# Patient Record
Sex: Female | Born: 1935 | Race: White | Hispanic: No | Smoking: Never smoker
Health system: Southern US, Community
[De-identification: ages and names within clinical notes are randomized; demographics above are authoritative.]

## PROBLEM LIST (undated history)

## (undated) DIAGNOSIS — I4891 Unspecified atrial fibrillation: Secondary | ICD-10-CM

## (undated) DIAGNOSIS — G309 Alzheimer's disease, unspecified: Secondary | ICD-10-CM

## (undated) DIAGNOSIS — F29 Unspecified psychosis not due to a substance or known physiological condition: Secondary | ICD-10-CM

## (undated) DIAGNOSIS — F0280 Dementia in other diseases classified elsewhere without behavioral disturbance: Secondary | ICD-10-CM

## (undated) DIAGNOSIS — K219 Gastro-esophageal reflux disease without esophagitis: Secondary | ICD-10-CM

## (undated) DIAGNOSIS — E785 Hyperlipidemia, unspecified: Secondary | ICD-10-CM

## (undated) DIAGNOSIS — F32A Depression, unspecified: Secondary | ICD-10-CM

## (undated) DIAGNOSIS — K589 Irritable bowel syndrome without diarrhea: Secondary | ICD-10-CM

## (undated) DIAGNOSIS — Z66 Do not resuscitate: Secondary | ICD-10-CM

## (undated) DIAGNOSIS — F329 Major depressive disorder, single episode, unspecified: Secondary | ICD-10-CM

---

## 2013-01-27 ENCOUNTER — Emergency Department (HOSPITAL_BASED_OUTPATIENT_CLINIC_OR_DEPARTMENT_OTHER): Payer: Medicare Other

## 2013-01-27 ENCOUNTER — Emergency Department (HOSPITAL_BASED_OUTPATIENT_CLINIC_OR_DEPARTMENT_OTHER)
Admission: EM | Admit: 2013-01-27 | Discharge: 2013-01-27 | Disposition: A | Discharge status: Home / Self Care | Payer: Medicare Other | Attending: Emergency Medicine | Admitting: Emergency Medicine

## 2013-01-27 ENCOUNTER — Emergency Department (HOSPITAL_COMMUNITY): Payer: Self-pay

## 2013-01-27 ENCOUNTER — Encounter (HOSPITAL_BASED_OUTPATIENT_CLINIC_OR_DEPARTMENT_OTHER): Payer: Self-pay | Admitting: Student

## 2013-01-27 DIAGNOSIS — Z79899 Other long term (current) drug therapy: Secondary | ICD-10-CM | POA: Insufficient documentation

## 2013-01-27 DIAGNOSIS — G309 Alzheimer's disease, unspecified: Secondary | ICD-10-CM | POA: Insufficient documentation

## 2013-01-27 DIAGNOSIS — E785 Hyperlipidemia, unspecified: Secondary | ICD-10-CM | POA: Insufficient documentation

## 2013-01-27 DIAGNOSIS — Y9229 Other specified public building as the place of occurrence of the external cause: Secondary | ICD-10-CM | POA: Insufficient documentation

## 2013-01-27 DIAGNOSIS — F3289 Other specified depressive episodes: Secondary | ICD-10-CM | POA: Insufficient documentation

## 2013-01-27 DIAGNOSIS — I4891 Unspecified atrial fibrillation: Secondary | ICD-10-CM | POA: Insufficient documentation

## 2013-01-27 DIAGNOSIS — F329 Major depressive disorder, single episode, unspecified: Secondary | ICD-10-CM | POA: Insufficient documentation

## 2013-01-27 DIAGNOSIS — W1809XA Striking against other object with subsequent fall, initial encounter: Secondary | ICD-10-CM | POA: Insufficient documentation

## 2013-01-27 DIAGNOSIS — S0003XA Contusion of scalp, initial encounter: Secondary | ICD-10-CM | POA: Insufficient documentation

## 2013-01-27 DIAGNOSIS — S1093XA Contusion of unspecified part of neck, initial encounter: Secondary | ICD-10-CM | POA: Insufficient documentation

## 2013-01-27 DIAGNOSIS — F0280 Dementia in other diseases classified elsewhere without behavioral disturbance: Secondary | ICD-10-CM | POA: Insufficient documentation

## 2013-01-27 DIAGNOSIS — Z8719 Personal history of other diseases of the digestive system: Secondary | ICD-10-CM | POA: Insufficient documentation

## 2013-01-27 DIAGNOSIS — Y9301 Activity, walking, marching and hiking: Secondary | ICD-10-CM | POA: Insufficient documentation

## 2013-01-27 DIAGNOSIS — F29 Unspecified psychosis not due to a substance or known physiological condition: Secondary | ICD-10-CM | POA: Insufficient documentation

## 2013-01-27 DIAGNOSIS — Z23 Encounter for immunization: Secondary | ICD-10-CM | POA: Insufficient documentation

## 2013-01-27 HISTORY — DX: Dementia in other diseases classified elsewhere, unspecified severity, without behavioral disturbance, psychotic disturbance, mood disturbance, and anxiety: F02.80

## 2013-01-27 HISTORY — DX: Unspecified atrial fibrillation: I48.91

## 2013-01-27 HISTORY — DX: Unspecified psychosis not due to a substance or known physiological condition: F29

## 2013-01-27 HISTORY — DX: Hyperlipidemia, unspecified: E78.5

## 2013-01-27 HISTORY — DX: Alzheimer's disease, unspecified: G30.9

## 2013-01-27 HISTORY — DX: Do not resuscitate: Z66

## 2013-01-27 HISTORY — DX: Depression, unspecified: F32.A

## 2013-01-27 HISTORY — DX: Gastro-esophageal reflux disease without esophagitis: K21.9

## 2013-01-27 HISTORY — DX: Major depressive disorder, single episode, unspecified: F32.9

## 2013-01-27 HISTORY — DX: Irritable bowel syndrome, unspecified: K58.9

## 2013-01-27 MED ORDER — TETANUS-DIPHTH-ACELL PERTUSSIS 5-2.5-18.5 LF-MCG/0.5 IM SUSP
0.5000 mL | Freq: Once | INTRAMUSCULAR | Status: AC
  Administered 2013-01-27: 0.5 mL via INTRAMUSCULAR
  Filled 2013-01-27: qty 0.5

## 2013-01-27 NOTE — ED Notes (Signed)
Pt in via EMS from [facility] with c/o falling on curb while ambulating and hitting back of head. Presents to ED with golf ball sized hematoma. No LOC s/p or prior to fall. Pt is part of skilled memory unit and AO x 3 but not to time - baseline. PERRLA, follows commands, grips equal. No bleeding at site of impact.

## 2013-01-27 NOTE — ED Provider Notes (Signed)
CSN: [id number]     Arrival date & time 01/27/13  1957 History     First MD Initiated Contact with Patient 01/27/13 2004     Chief Complaint  Patient presents with  . Fall   (Consider location/radiation/quality/duration/timing/severity/associated sxs/prior Treatment) HPI Comments: Patient is an 79 year old female with a history of Alzheimer's dementia and atrial fibrillation who presents after a mechanical fall this evening. Patient states that she was leaving a restaurant after a birthday dinner when she lost her footing on the curb, subsequently falling backwards. Patient hit her head on the pavement and denies loss of consciousness. She denies any pain complaints. Patient further denies vision changes, difficulty speaking or swallowing, tinnitus, nausea, vomiting, numbness/tingling, extremity weakness, and an inability to ambulate. Patient denies the use of blood thinners.  Patient is a 79 y.o. female presenting with fall. The history is provided by the patient. No language interpreter was used.  Fall Pertinent negatives include no fever, nausea, numbness, vomiting or weakness.    Past Medical History  Diagnosis Date  . Alzheimer disease   . Depression   . Psychosis   . Atrial fibrillation   . Hyperlipidemia   . IBS (irritable bowel syndrome)   . GERD (gastroesophageal reflux disease)   . DNR (do not resuscitate)    History reviewed. No pertinent past surgical history. No family history on file. History  Substance Use Topics  . Smoking status: Never Smoker   . Smokeless tobacco: Not on file  . Alcohol Use: No   OB History   Grav Para Term Preterm Abortions TAB SAB Ect Mult Living                 Review of Systems  Constitutional: Negative for fever.  HENT: Negative for hearing loss, trouble swallowing and tinnitus.   Eyes: Negative for visual disturbance.  Gastrointestinal: Negative for nausea and vomiting.  Musculoskeletal: Negative for gait problem.  Neurological:  Negative for syncope, speech difficulty, weakness and numbness.    Allergies  Review of patient's allergies indicates no known allergies.  Home Medications   Current Outpatient Rx  Name  Route  Sig  Dispense  Refill  . acetaminophen (TYLENOL) 500 MG tablet   Oral   Take 500 mg by mouth every 6 (six) hours as needed for pain.         Marland Kitchen atorvastatin (LIPITOR) 20 MG tablet   Oral   Take 20 mg by mouth daily.         . digoxin (LANOXIN) 0.125 MG tablet   Oral   Take 0.125 mg by mouth daily.         Marland Kitchen diltiazem (DILACOR XR) 180 MG 24 hr capsule   Oral   Take 180 mg by mouth daily.         . Eyelid Cleansers (OCUSOFT LID SCRUB PLUS EX)   Apply externally   Apply topically.         Marland Kitchen levothyroxine (SYNTHROID, LEVOTHROID) 75 MCG tablet   Oral   Take 75 mcg by mouth daily before breakfast.         . memantine (NAMENDA) 5 MG tablet   Oral   Take 28 mg by mouth daily.         Marland Kitchen perphenazine (TRILAFON) 2 MG tablet   Oral   Take 2 mg by mouth 2 (two) times daily.         . propafenone (RYTHMOL) 150 MG tablet   Oral   Take  150 mg by mouth every 8 (eight) hours.         . propantheline (PROBANTHINE) 15 MG tablet   Oral   Take 15 mg by mouth 4 (four) times daily.         . risperiDONE (RISPERDAL) 0.25 MG tablet   Oral   Take 0.25 mg by mouth 2 (two) times daily.         . rivastigmine (EXELON) 9.5 mg/24hr   Transdermal   Place 1 patch onto the skin daily.         . sertraline (ZOLOFT) 100 MG tablet   Oral   Take 100 mg by mouth daily.         . traMADol (ULTRAM) 50 MG tablet   Oral   Take 50 mg by mouth every 8 (eight) hours as needed for pain.          BP 156/77  Pulse 54  Temp(Src) 98 F (36.7 C) (Oral)  Resp 18  SpO2 96%  Physical Exam  Nursing note and vitals reviewed. Constitutional: She is oriented to person, place, and time. She appears well-developed and well-nourished. No distress.  HENT:  Head: Normocephalic.   Mouth/Throat: Oropharynx is clear and moist. No oropharyngeal exudate.  Hematoma to posterior parietal scalp 3cm in diameter; nontender  Eyes: Conjunctivae and EOM are normal. Pupils are equal, round, and reactive to light. No scleral icterus.  Neck: Neck supple.  No tenderness to palpation of the cervical midline  Cardiovascular: Normal rate, regular rhythm, normal heart sounds and intact distal pulses.   Pulmonary/Chest: Effort normal and breath sounds normal. No respiratory distress. She has no wheezes. She has no rales.  Abdominal: Soft. She exhibits no distension. There is no tenderness.  Musculoskeletal: Normal range of motion.  Lymphadenopathy:    She has no cervical adenopathy.  Neurological: She is alert and oriented to person, place, and time.  Patient speaks in full goal oriented sentences. Cranial nerves III through XII grossly intact. No sensory or motor deficits appreciated and patient was extremities without ataxia. Grip strength 5 out of 5 in bilateral upper and lower extremities. DTRs normal and symmetric. Patient mentating at baseline.  Skin: Skin is warm and dry. No rash noted. She is not diaphoretic. No pallor.  Psychiatric: She has a normal mood and affect. Her behavior is normal.   ED Course   Procedures (including critical care time)  Labs Reviewed - No data to display Ct Head Wo Contrast  01/27/2013   *RADIOLOGY REPORT*  Clinical Data:  79 year old female status post fall with posterior head injury, hematoma.  CT HEAD WITHOUT CONTRAST CT CERVICAL SPINE WITHOUT CONTRAST  Technique:  Multidetector CT imaging of the head and cervical spine was performed following the standard protocol without intravenous contrast.  Multiplanar CT image reconstructions of the cervical spine were also generated.  Comparison:   None  CT HEAD  Findings: Posterior right superior convexity scalp hematoma measuring up to 9 mm in thickness.  Underlying calvarium intact. Visible scalp and orbits  soft tissues elsewhere are within normal limits.  Visualized paranasal sinuses and mastoids are clear.  Calcified atherosclerosis at the skull base.  Cerebral volume is within normal limits for age.  No midline shift, ventriculomegaly, mass effect, evidence of mass lesion, intracranial hemorrhage or evidence of cortically based acute infarction.  Gray-white matter differentiation is within normal limits throughout the brain.  No suspicious intracranial vascular hyperdensity.  IMPRESSION: 1.  Right vertex scalp hematoma without underlying calvarium  fracture. 2. Normal noncontrast CT appearance of the brain for age. 3.  Cervical spine findings below.  CT CERVICAL SPINE  Findings: Visualized skull base is intact.  No atlanto-occipital dissociation.  Cervicothoracic junction alignment is within normal limits.  Bilateral posterior element alignment is within normal limits.  Trace anterolisthesis of C4 on C5.  Multilevel cervical disc space loss and endplate degeneration.  No acute cervical spine fracture identified.  Visualized upper thoracic levels appear grossly intact.  Negative lung apices.  Left carotid bifurcation calcified atherosclerosis.  Dental hardware streak artifact. Visualized paraspinal soft tissues are within normal limits.  IMPRESSION: No acute fracture or listhesis identified in the cervical spine. Ligamentous injury is not excluded.  Trace anterolisthesis of C4 on C5 appears to be degenerative.   Original Report Authenticated By: Erskine Speed, M.D.   Ct Cervical Spine Wo Contrast  01/27/2013   *RADIOLOGY REPORT*  Clinical Data:  79 year old female status post fall with posterior head injury, hematoma.  CT HEAD WITHOUT CONTRAST CT CERVICAL SPINE WITHOUT CONTRAST  Technique:  Multidetector CT imaging of the head and cervical spine was performed following the standard protocol without intravenous contrast.  Multiplanar CT image reconstructions of the cervical spine were also generated.  Comparison:    None  CT HEAD  Findings: Posterior right superior convexity scalp hematoma measuring up to 9 mm in thickness.  Underlying calvarium intact. Visible scalp and orbits soft tissues elsewhere are within normal limits.  Visualized paranasal sinuses and mastoids are clear.  Calcified atherosclerosis at the skull base.  Cerebral volume is within normal limits for age.  No midline shift, ventriculomegaly, mass effect, evidence of mass lesion, intracranial hemorrhage or evidence of cortically based acute infarction.  Gray-white matter differentiation is within normal limits throughout the brain.  No suspicious intracranial vascular hyperdensity.  IMPRESSION: 1.  Right vertex scalp hematoma without underlying calvarium fracture. 2. Normal noncontrast CT appearance of the brain for age. 3.  Cervical spine findings below.  CT CERVICAL SPINE  Findings: Visualized skull base is intact.  No atlanto-occipital dissociation.  Cervicothoracic junction alignment is within normal limits.  Bilateral posterior element alignment is within normal limits.  Trace anterolisthesis of C4 on C5.  Multilevel cervical disc space loss and endplate degeneration.  No acute cervical spine fracture identified.  Visualized upper thoracic levels appear grossly intact.  Negative lung apices.  Left carotid bifurcation calcified atherosclerosis.  Dental hardware streak artifact. Visualized paraspinal soft tissues are within normal limits.  IMPRESSION: No acute fracture or listhesis identified in the cervical spine. Ligamentous injury is not excluded.  Trace anterolisthesis of C4 on C5 appears to be degenerative.   Original Report Authenticated By: Erskine Speed, M.D.   1. Scalp hematoma, initial encounter    MDM  79 year old female presents for scalp hematoma after mechanical fall this evening. Patient mentating at baseline without focal neurologic deficits. Tetanus updated as patient with skin abrasion to right elbow. CT head significant for right vertex  scalp hematoma without underlying fracture. No evidence of acute fracture to the cervical spine. Patient without midline tenderness to the cervical spine; clears NEXUS criteria. Patient without any pain complaints during the entirety of ED stay. Appropriate for discharge with primary care followup to ensure no worsening of symptoms. Indications for ED return provided. Patient seen also by Dr. Fredderick Phenix who is in agreement with this workup, assessment, management plan, and patient's stability for discharge.    Antony Madura, PA-C 01/27/13 2151

## 2013-01-27 NOTE — ED Provider Notes (Signed)
Medical screening examination/treatment/procedure(s) were conducted as a shared visit with non-physician practitioner(s) and myself.  I personally evaluated the patient during the encounter Pt with pain to back of head from mechanical fall.  No other apparent injury.  Will check CT head/c-spine  Rolan Bucco, MD 01/27/13 2344

## 2013-02-24 ENCOUNTER — Ambulatory Visit: Admit: 2013-02-24 | Payer: Self-pay | Admitting: Neurosurgery

## 2013-02-24 SURGERY — LUMBAR LAMINECTOMY/DECOMPRESSION MICRODISCECTOMY 1 LEVEL
Anesthesia: General | Laterality: Left

## 2015-01-31 IMAGING — CT CT HEAD W/O CM
1 series · 15 of 30 positions shown, 19 images · non-contrast
Comparison: None

CT HEAD

CLINICAL DATA: 84-year-old female status post fall with posterior
head injury, hematoma.

CT HEAD WITHOUT CONTRAST
CT CERVICAL SPINE WITHOUT CONTRAST
TECHNIQUE: Multidetector CT imaging of the head and cervical spine
was performed following the standard protocol without intravenous
contrast.  Multiplanar CT image reconstructions of the cervical
spine were also generated.

[Series 2: head 4.8 h37s · axial · 0.45mm/px · z∈[-126,+7]mm · 15 of 32 slices shown, 19 images]
[im 2/32  brain]
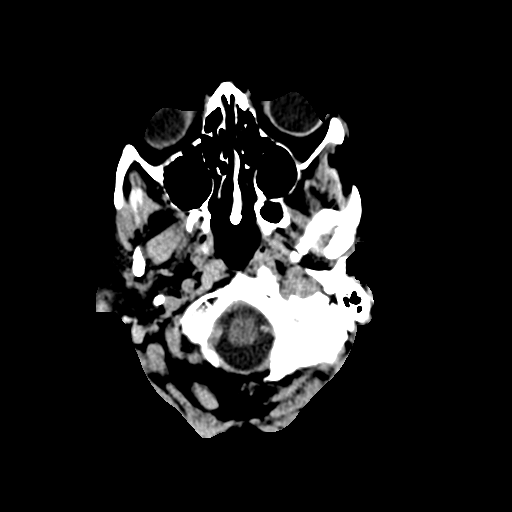
[im 2/32  bone]
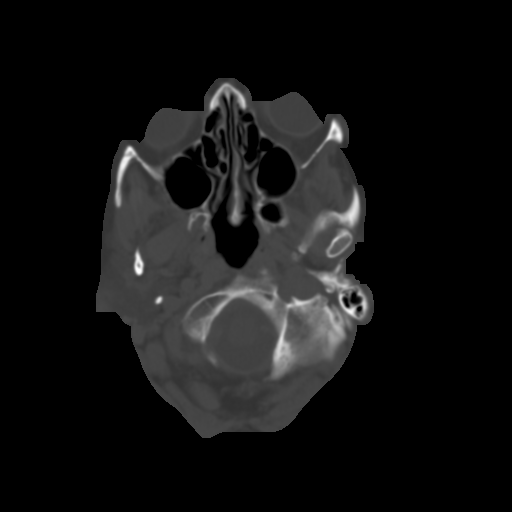
[im 4/32  brain]
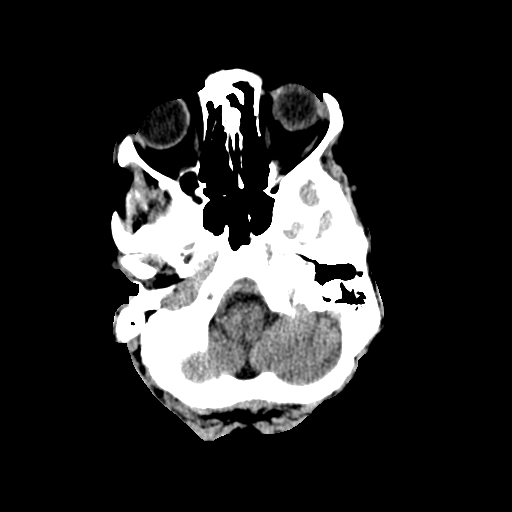
[im 6/32  brain]
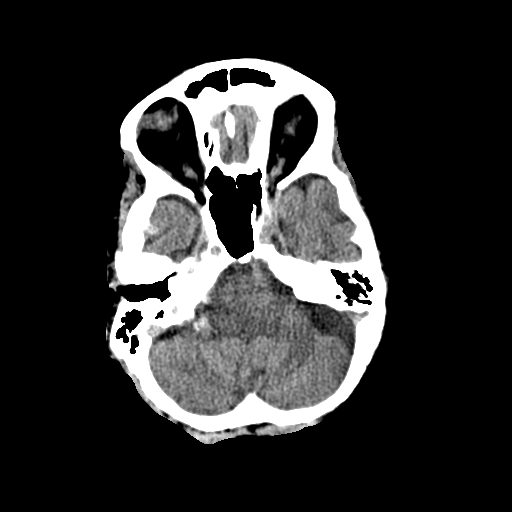
[im 8/32  brain]
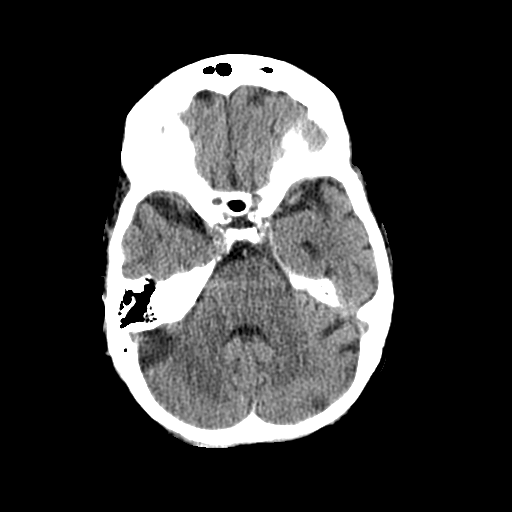
[im 10/32  brain]
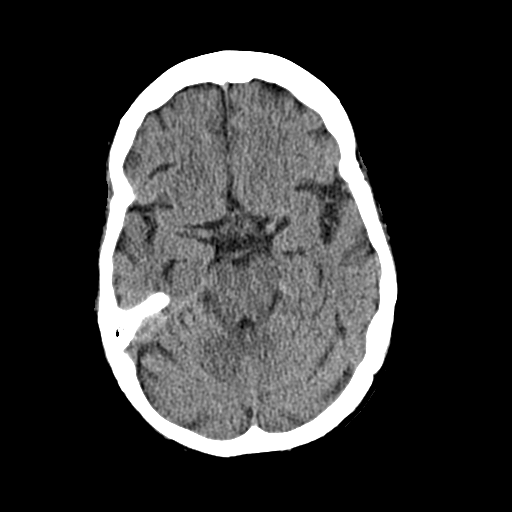
[im 10/32  bone]
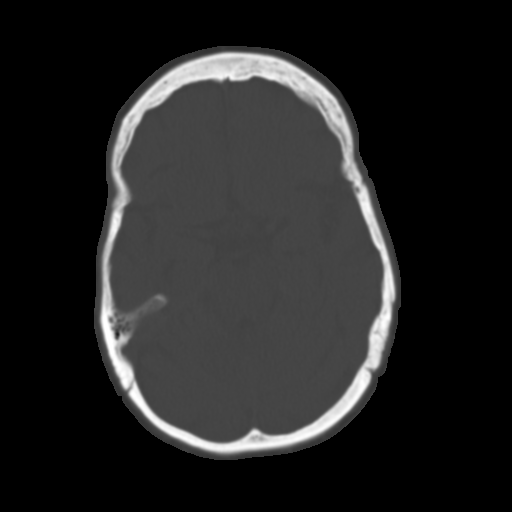
[im 12/32  brain]
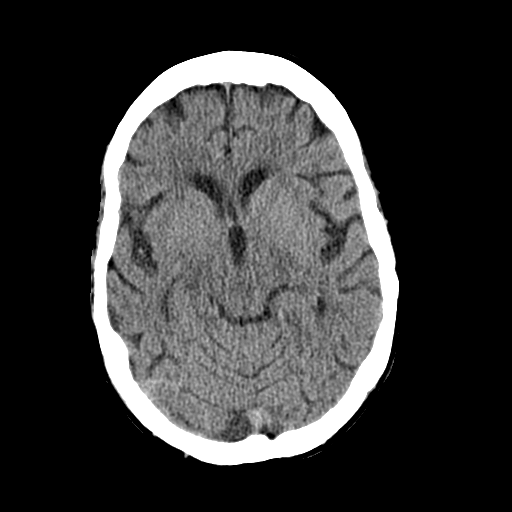
[im 14/32  brain]
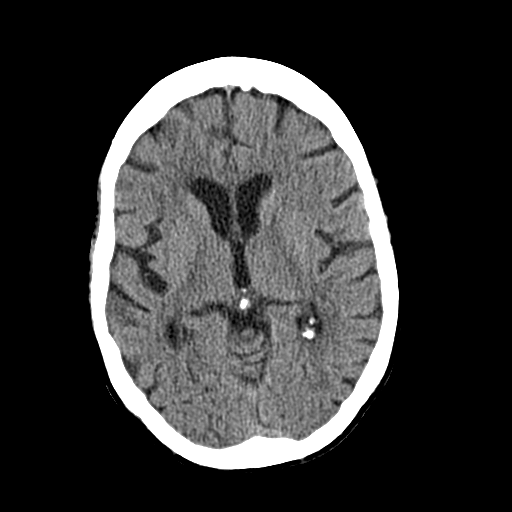
[im 17/32  brain]
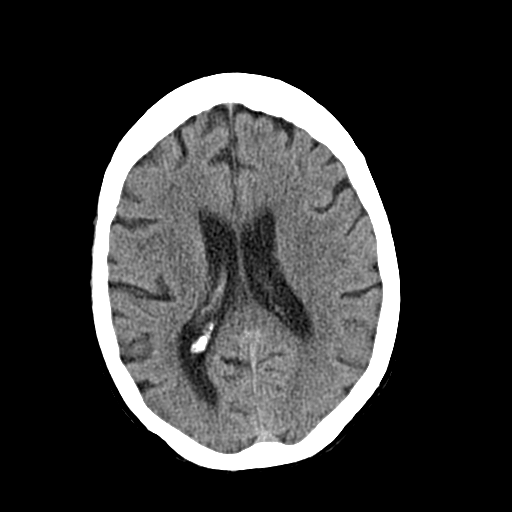
[im 18/32  brain]
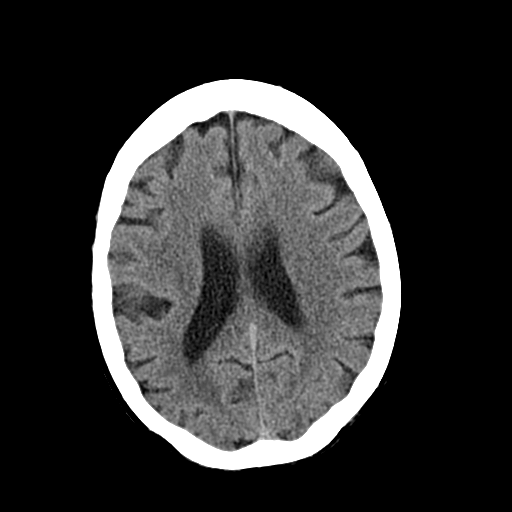
[im 18/32  bone]
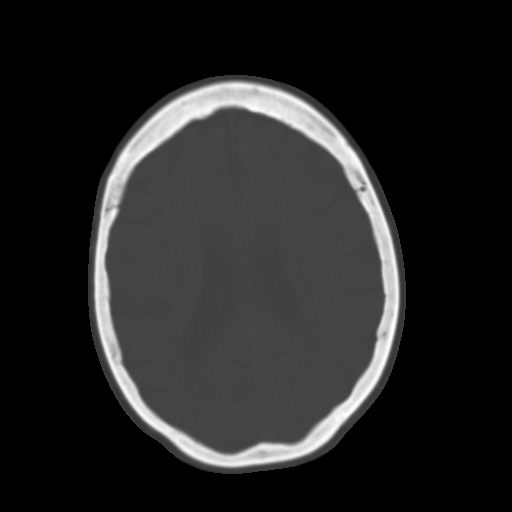
[im 20/32  brain]
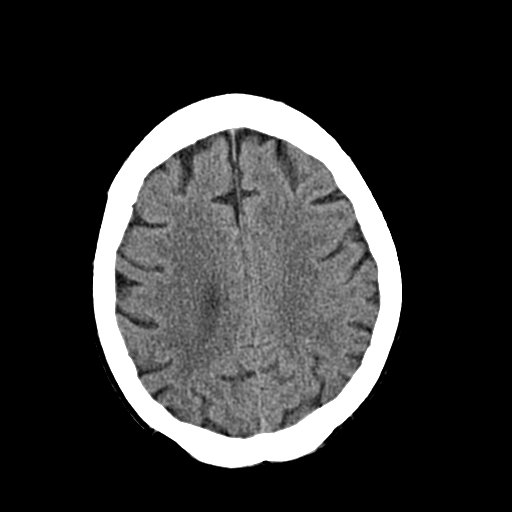
[im 22/32  brain]
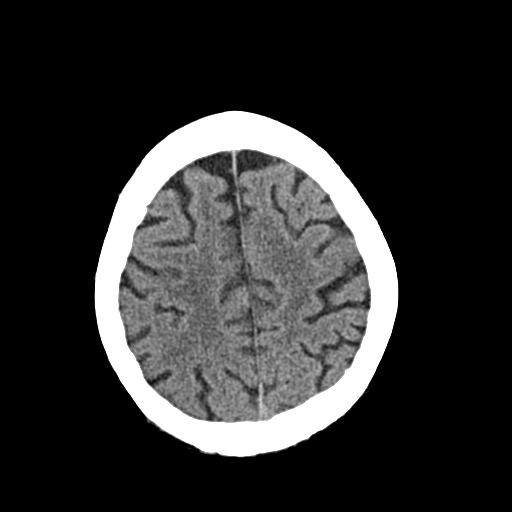
[im 24/32  brain]
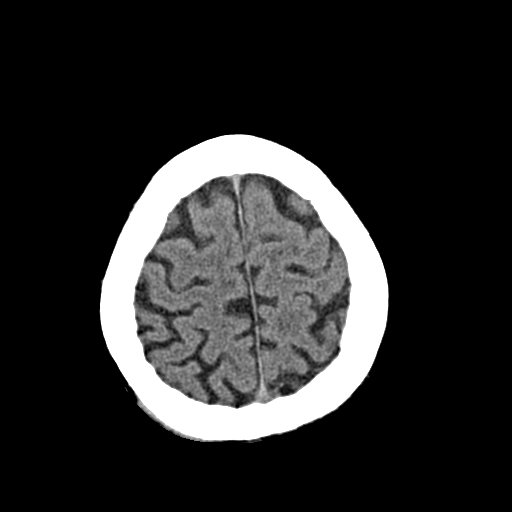
[im 26/32  brain]
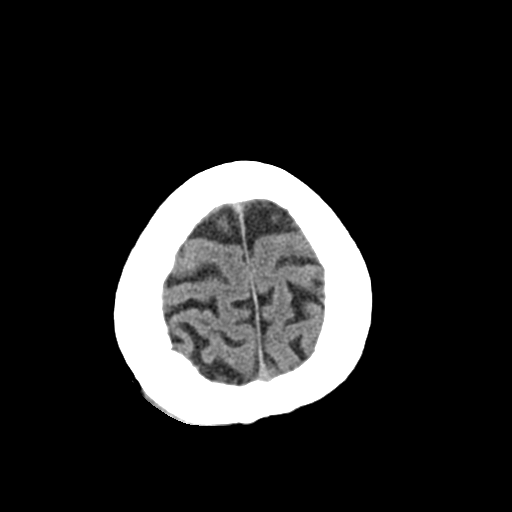
[im 26/32  bone]
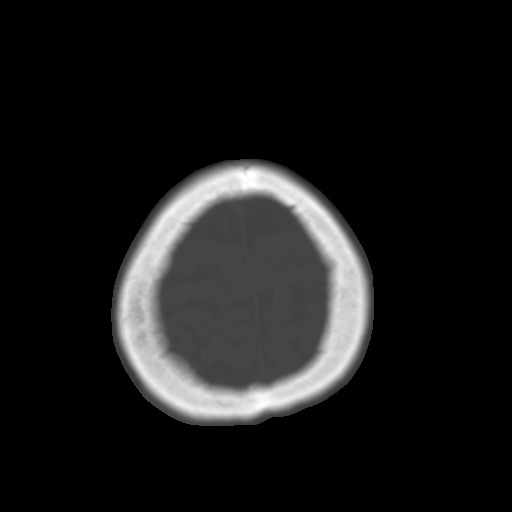
[im 28/32  brain]
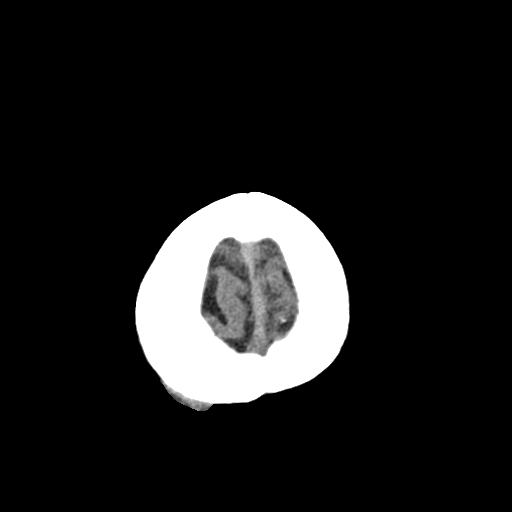
[im 30/32  brain]
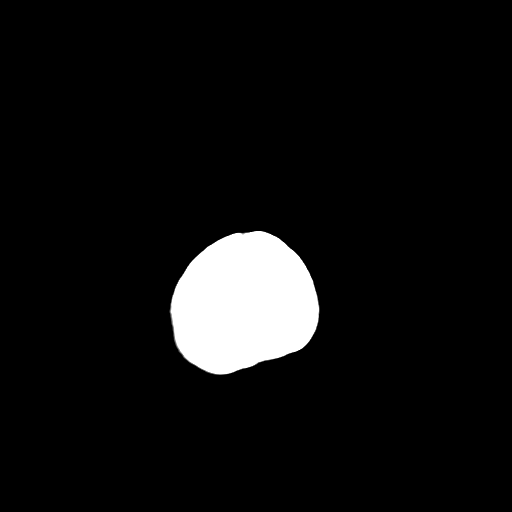

[15 of 30 positions shown; findings below may reference images not displayed]

FINDINGS: Posterior right superior convexity scalp hematoma
measuring up to 9 mm in thickness.  Underlying calvarium intact.
Visible scalp and orbits soft tissues elsewhere are within normal
limits.

Visualized paranasal sinuses and mastoids are clear.

Calcified atherosclerosis at the skull base.  Cerebral volume is
within normal limits for age.  No midline shift, ventriculomegaly,
mass effect, evidence of mass lesion, intracranial hemorrhage or
evidence of cortically based acute infarction.  Gray-white matter
differentiation is within normal limits throughout the brain.  No
suspicious intracranial vascular hyperdensity.
IMPRESSION: 1.  Right vertex scalp hematoma without underlying calvarium
fracture.
2. Normal noncontrast CT appearance of the brain for age.
3.  Cervical spine findings below.

CT CERVICAL SPINE
FINDINGS: Visualized skull base is intact.  No atlanto-occipital
dissociation.  Cervicothoracic junction alignment is within normal
limits.  Bilateral posterior element alignment is within normal
limits.  Trace anterolisthesis of C4 on C5.  Multilevel cervical
disc space loss and endplate degeneration.  No acute cervical spine
fracture identified.  Visualized upper thoracic levels appear
grossly intact.

Negative lung apices.  Left carotid bifurcation calcified
atherosclerosis.  Dental hardware streak artifact. Visualized
paraspinal soft tissues are within normal limits.
IMPRESSION: No acute fracture or listhesis identified in the cervical spine.
Ligamentous injury is not excluded.  Trace anterolisthesis of C4 on
C5 appears to be degenerative.

## 2015-10-06 ENCOUNTER — Encounter (HOSPITAL_COMMUNITY): Payer: Self-pay | Admitting: *Deleted

## 2015-10-06 ENCOUNTER — Emergency Department (HOSPITAL_COMMUNITY)
Admission: EM | Admit: 2015-10-06 | Discharge: 2015-10-06 | Disposition: A | Discharge status: Home / Self Care | Payer: Medicare Other | Attending: Emergency Medicine | Admitting: Emergency Medicine

## 2015-10-06 ENCOUNTER — Emergency Department (HOSPITAL_COMMUNITY): Payer: Medicare Other

## 2015-10-06 DIAGNOSIS — F329 Major depressive disorder, single episode, unspecified: Secondary | ICD-10-CM | POA: Diagnosis not present

## 2015-10-06 DIAGNOSIS — Z79899 Other long term (current) drug therapy: Secondary | ICD-10-CM | POA: Insufficient documentation

## 2015-10-06 DIAGNOSIS — G309 Alzheimer's disease, unspecified: Secondary | ICD-10-CM | POA: Diagnosis not present

## 2015-10-06 DIAGNOSIS — E785 Hyperlipidemia, unspecified: Secondary | ICD-10-CM | POA: Diagnosis not present

## 2015-10-06 DIAGNOSIS — R569 Unspecified convulsions: Secondary | ICD-10-CM | POA: Diagnosis not present

## 2015-10-06 DIAGNOSIS — F0280 Dementia in other diseases classified elsewhere without behavioral disturbance: Secondary | ICD-10-CM | POA: Insufficient documentation

## 2015-10-06 LAB — CBC
HEMATOCRIT: 40.1 % (ref 36.0–46.0)
HEMOGLOBIN: 13.6 g/dL (ref 12.0–15.0)
MCH: 31.8 pg (ref 26.0–34.0)
MCHC: 33.9 g/dL (ref 30.0–36.0)
MCV: 93.7 fL (ref 78.0–100.0)
Platelets: 244 10*3/uL (ref 150–400)
RBC: 4.28 MIL/uL (ref 3.87–5.11)
RDW: 12.2 % (ref 11.5–15.5)
WBC: 7.9 10*3/uL (ref 4.0–10.5)

## 2015-10-06 LAB — BASIC METABOLIC PANEL
Anion gap: 17 — ABNORMAL HIGH (ref 5–15)
BUN: 11 mg/dL (ref 6–20)
CALCIUM: 9.7 mg/dL (ref 8.9–10.3)
CO2: 22 mmol/L (ref 22–32)
CREATININE: 0.78 mg/dL (ref 0.44–1.00)
Chloride: 103 mmol/L (ref 101–111)
GFR calc Af Amer: >60 mL/min (ref >60)
GLUCOSE: 98 mg/dL (ref 65–99)
POTASSIUM: 3.2 mmol/L — AB (ref 3.5–5.1)
SODIUM: 142 mmol/L (ref 135–145)

## 2015-10-06 LAB — I-STAT TROPONIN, ED: TROPONIN I, POC: 0.02 ng/mL (ref 0.00–0.08)

## 2015-10-06 LAB — CBG MONITORING, ED: GLUCOSE-CAPILLARY: 78 mg/dL (ref 65–99)

## 2015-10-06 MED ORDER — SODIUM CHLORIDE 0.9 % IV SOLN
1000.0000 mg | Freq: Once | INTRAVENOUS | Status: AC
  Administered 2015-10-06: 1000 mg via INTRAVENOUS
  Filled 2015-10-06: qty 10

## 2015-10-06 MED ORDER — LEVETIRACETAM 500 MG PO TABS: 500.0000 mg | ORAL_TABLET | Freq: Two times a day (BID) | ORAL | Status: AC

## 2015-10-06 NOTE — ED Provider Notes (Signed)
CSN: [id number]     Arrival date & time 10/06/15  1523 History   First MD Initiated Contact with Patient 10/06/15 1619     Chief Complaint  Patient presents with  . Seizures      HPI  She presents for evaluation after probable seizure. Lives at [facility] with a history of dementia. No history of documented seizures. They describe a generalized seizure 2 weeks ago, and into today, 30 seconds long and 2 minutes apart.  She is not able to offer details here. On arrival she states "I don't know why I am here". No bite to the tongue. No incontinence. No reported fall or injury.  Past Medical History  Diagnosis Date  . Alzheimer disease   . Depression   . Psychosis   . Atrial fibrillation (HCC)   . Hyperlipidemia   . IBS (irritable bowel syndrome)   . GERD (gastroesophageal reflux disease)   . DNR (do not resuscitate)    History reviewed. No pertinent past surgical history. History reviewed. No pertinent family history. Social History  Substance Use Topics  . Smoking status: Never Smoker   . Smokeless tobacco: None  . Alcohol Use: No   OB History    No data available     Review of Systems  Unable to perform ROS: Dementia      Allergies  Review of patient's allergies indicates no known allergies.  Home Medications   Prior to Admission medications   Medication Sig Start Date End Date Taking? Authorizing Provider  acetaminophen (TYLENOL) 500 MG tablet Take 1,000 mg by mouth 3 (three) times daily.    Yes Historical Provider, MD  amoxicillin (AMOXIL) 500 MG tablet Take 2,000 mg by mouth daily as needed (for dental procedures).   Yes Historical Provider, MD  cloNIDine (CATAPRES) 0.1 MG tablet Take 0.1 mg by mouth daily as needed (for SBP>175 or DBP>100).   Yes Historical Provider, MD  diltiazem (CARDIZEM CD) 180 MG 24 hr capsule Take 180 mg by mouth daily.   Yes Historical Provider, MD  food thickener (THICK IT) POWD Take by mouth as needed (nectar  thick liquid).   Yes Historical Provider, MD  HYDROcodone-acetaminophen (NORCO/VICODIN) 5-325 MG tablet Take 1 tablet by mouth every 6 (six) hours as needed for moderate pain.   Yes Historical Provider, MD  levothyroxine (SYNTHROID, LEVOTHROID) 75 MCG tablet Take 75 mcg by mouth daily before breakfast.   Yes Historical Provider, MD  LORazepam (ATIVAN) 0.5 MG tablet Take 0.25 mg by mouth 2 (two) times daily.   Yes Historical Provider, MD  memantine (NAMENDA) 10 MG tablet Take 10 mg by mouth 2 (two) times daily.   Yes Historical Provider, MD  mirtazapine (REMERON) 7.5 MG tablet Take 7.5 mg by mouth at bedtime.   Yes Historical Provider, MD  Multiple Vitamin (MULTIVITAMIN WITH MINERALS) TABS tablet Take 1 tablet by mouth daily.   Yes Historical Provider, MD  polyethylene glycol (MIRALAX / GLYCOLAX) packet Take 17 g by mouth every evening.   Yes Historical Provider, MD  potassium chloride (K-DUR,KLOR-CON) 10 MEQ tablet Take 10 mEq by mouth daily.   Yes Historical Provider, MD  QUEtiapine (SEROQUEL) 25 MG tablet Take 25 mg by mouth 2 (two) times daily.   Yes Historical Provider, MD  Rivastigmine (EXELON) 13.3 MG/24HR PT24 Place 1 patch onto the skin every morning.   Yes Historical Provider, MD  sertraline (ZOLOFT) 50 MG tablet Take 50 mg by mouth daily.   Yes Historical  Provider, MD  diltiazem (DILACOR XR) 180 MG 24 hr capsule Take 180 mg by mouth daily.    Historical Provider, MD  Eyelid Cleansers (OCUSOFT LID SCRUB PLUS EX) Apply topically.    Historical Provider, MD  levETIRAcetam (KEPPRA) 500 MG tablet Take 1 tablet (500 mg total) by mouth 2 (two) times daily. 10/06/15   Rolland Porter, MD  oseltamivir (TAMIFLU) 75 MG capsule Take 75 mg by mouth See admin instructions. Reported on 10/06/2015    Historical Provider, MD  perphenazine (TRILAFON) 2 MG tablet Take 2 mg by mouth 2 (two) times daily.    Historical Provider, MD   BP 125/62 mmHg  Pulse 63  Resp 14  SpO2 97% Physical Exam  Constitutional: She  appears well-developed and well-nourished. No distress.  HENT:  Head: Normocephalic.  Eyes: Conjunctivae are normal. Pupils are equal, round, and reactive to light. No scleral icterus.  Neck: Normal range of motion. Neck supple. No thyromegaly present.  Cardiovascular: Normal rate and regular rhythm.  Exam reveals no gallop and no friction rub.   No murmur heard. Pulmonary/Chest: Effort normal and breath sounds normal. No respiratory distress. She has no wheezes. She has no rales.  Abdominal: Soft. Bowel sounds are normal. She exhibits no distension. There is no tenderness. There is no rebound.  Musculoskeletal: Normal range of motion.  Neurological: She is alert.  Not able to follow with conversation. No sign of injury to the head. She is in a sinus rhythm. Moving all 4 extremities.  Skin: Skin is warm and dry. No rash noted.  Psychiatric: She has a normal mood and affect. Her behavior is normal.    ED Course  Procedures (including critical care time) Labs Review Labs Reviewed  BASIC METABOLIC PANEL - Abnormal; Notable for the following:    Potassium 3.2 (*)    Anion gap 17 (*)    All other components within normal limits  CBC  CBG MONITORING, ED  I-STAT TROPOININ, ED    Imaging Review Ct Head Wo Contrast  10/06/2015  CLINICAL DATA:  Seizure x2 today. History of dementia. Initial encounter. EXAM: CT HEAD WITHOUT CONTRAST TECHNIQUE: Contiguous axial images were obtained from the base of the skull through the vertex without intravenous contrast. COMPARISON:  Head CT scan 01/27/2013. FINDINGS: Atrophy and chronic microvascular ischemic change are again seen as on the prior study. No evidence of acute abnormality including hemorrhage, infarct, mass lesion, mass effect, midline shift or abnormal extra-axial fluid collection is identified. No hydrocephalus or pneumocephalus. The calvarium is intact. Imaged paranasal sinuses and mastoid air cells are clear. IMPRESSION: No acute abnormality.  Atrophy and chronic microvascular ischemic change. Electronically Signed   By: Drusilla Kanner M.D.   On: 10/06/2015 18:06   I have personally reviewed and evaluated these images and lab results as part of my medical decision-making.   EKG Interpretation   Date/Time:  Sunday October 06 2015 15:59:29 EDT Ventricular Rate:  68 PR Interval:  167 QRS Duration: 87 QT Interval:  445 QTC Calculation: 473 R Axis:   106 Text Interpretation:  Sinus rhythm Right axis deviation Nonspecific T  abnrm, anterolateral leads Confirmed by Fayrene Fearing  MD, Makira Holleman ([id number]) on  10/06/2015 8:14:59 PM      MDM   Final diagnoses:  Seizure (HCC)      By description of staff assist her seizure in 2 weeks. He is awake. Demented. Nonfocal on her exam. CT scan does not show acute lateralizing findings. I discussed the case with our  neurologist on-call. The patient has dementia. Had difficulty cooperating with CT scan. I feel she would have difficulty cooperative with EEG or MRI without marked intervention or sedation. Recommendations ideally, would be EEG and MRI. Neurologist felt that this could be safely completed as an outpatient. He has left the decision regarding antiepileptics up to myself and the patient's primary care physician. I call the facility. No physician was available, no provider available. I was able to leave the message on the clinic staff phonebook and box. I placed the patient on Keppra and a prescription for low-dose 500 twice a day. Recommended to outpatient follow-up to discuss need and desire to completion of evaluation including MRI and EEG. Return to ER with any worsening or recurrent seizures.    Rolland Porter, MD 10/06/15 2019

## 2015-10-06 NOTE — ED Notes (Signed)
[facility] after suffering 2 seizures, each 30 seconds long and 2 minutes apart. Facility reports that pt had her first seizure 2 weeks ago but was not evaluated at the time. EMS reports no post ictal phase for them. States that pt has advanced dementia and was very anxious en route. State pt was afib 128 initially and returned to SR 75.

## 2015-10-06 NOTE — Discharge Instructions (Signed)
It is recommended that you follow-up with a neurologist for outpatient testing including MRI, and EEG to complete evaluation for new onset of seizures. On-call neurologist has recommended initiating antiseizure medication with Keppra. This is been prescribed by Dr. Fayrene FearingJames, the emergency room physician. First dose to be given tomorrow morning.   Seizure, Adult A seizure is abnormal electrical activity in the brain. Seizures usually last from 30 seconds to 2 minutes. There are various types of seizures. Before a seizure, you may have a warning sensation (aura) that a seizure is about to occur. An aura may include the following symptoms:   Fear or anxiety.  Nausea.  Feeling like the room is spinning (vertigo).  Vision changes, such as seeing flashing lights or spots. Common symptoms during a seizure include:  A change in attention or behavior (altered mental status).  Convulsions with rhythmic jerking movements.  Drooling.  Rapid eye movements.  Grunting.  Loss of bladder and bowel control.  Bitter taste in the mouth.  Tongue biting. After a seizure, you may feel confused and sleepy. You may also have an injury resulting from convulsions during the seizure. HOME CARE INSTRUCTIONS   If you are given medicines, take them exactly as prescribed by your health care provider.  Keep all follow-up appointments as directed by your health care provider.  Do not swim or drive or engage in risky activity during which a seizure could cause further injury to you or others until your health care provider says it is OK.  Get adequate rest.  Teach friends and family what to do if you have a seizure. They should:  Lay you on the ground to prevent a fall.  Put a cushion under your head.  Loosen any tight clothing around your neck.  Turn you on your side. If vomiting occurs, this helps keep your airway clear.  Stay with you until you recover.  Know whether or not you need emergency  care. SEEK IMMEDIATE MEDICAL CARE IF:  The seizure lasts longer than 5 minutes.  The seizure is severe or you do not wake up immediately after the seizure.  You have an altered mental status after the seizure.  You are having more frequent or worsening seizures. Someone should drive you to the emergency department or call local emergency services (911 in [place] MAKE SURE YOU:  Understand these instructions.  Will watch your condition.  Will get help right away if you are not doing well or get worse.   This information is not intended to replace advice given to you by your health care provider. Make sure you discuss any questions you have with your health care provider.   Document Released: 05/29/2000 Document Revised: 06/22/2014 Document Reviewed: 01/11/2013 Elsevier Interactive Patient Education [employer]

## 2015-10-06 NOTE — ED Notes (Signed)
Pt left at this time with all belongings.  

## 2015-10-06 NOTE — ED Notes (Signed)
[facility] [place] [facility]. Will transport pt back to facility via [facility].

## 2015-11-14 DEATH — deceased

## 2017-10-09 IMAGING — CT CT HEAD W/O CM
1 series · 16 of 30 positions shown, 20 images · non-contrast
Comparison: Head CT scan 01/27/2013.

CLINICAL DATA: Seizure x2 today. History of dementia. Initial
encounter.

EXAM:
CT HEAD WITHOUT CONTRAST
TECHNIQUE: Contiguous axial images were obtained from the base of the skull
through the vertex without intravenous contrast.

[Series 2: head 5.0 h30s · axial · 0.40mm/px · z∈[-140,-0]mm · 16 of 32 slices shown, 20 images]
[im 2/32  brain]
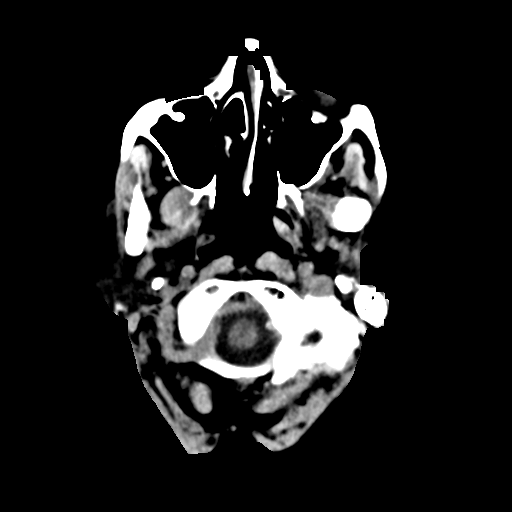
[im 2/32  bone]
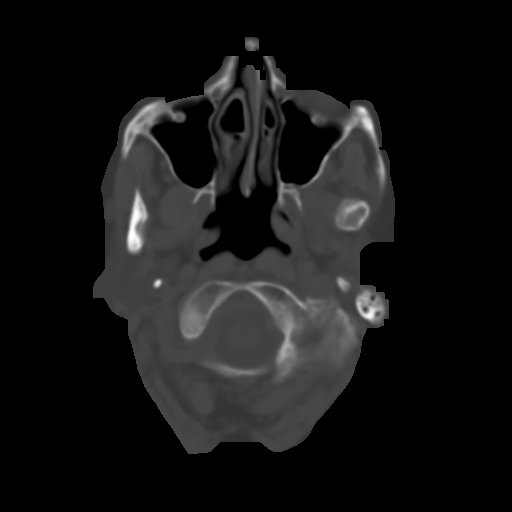
[im 4/32  brain]
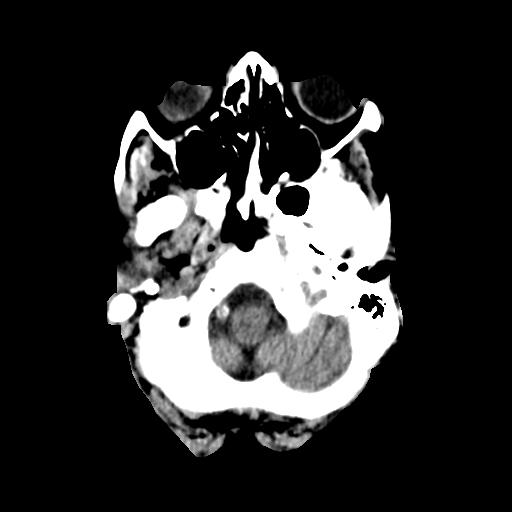
[im 6/32  brain]
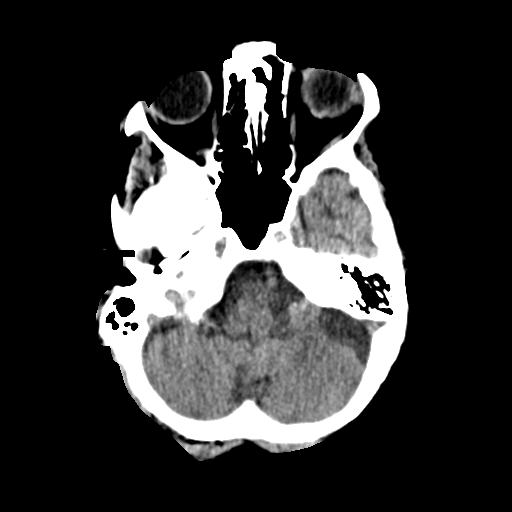
[im 8/32  brain]
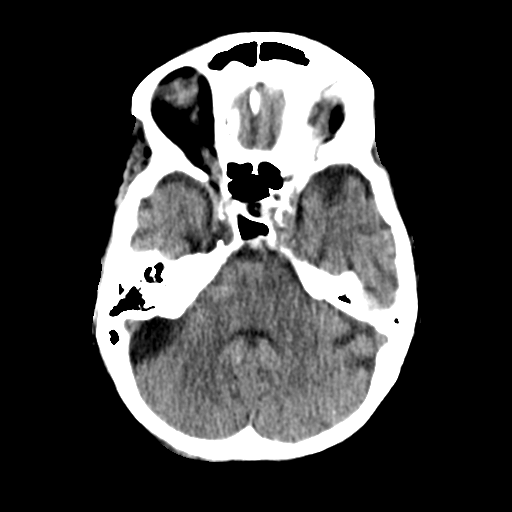
[im 9/32  brain]
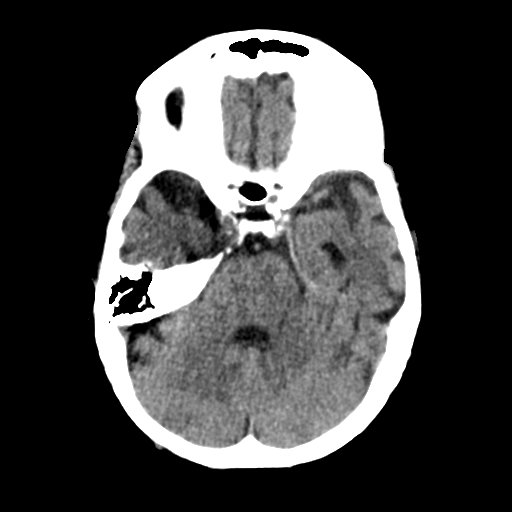
[im 9/32  bone]
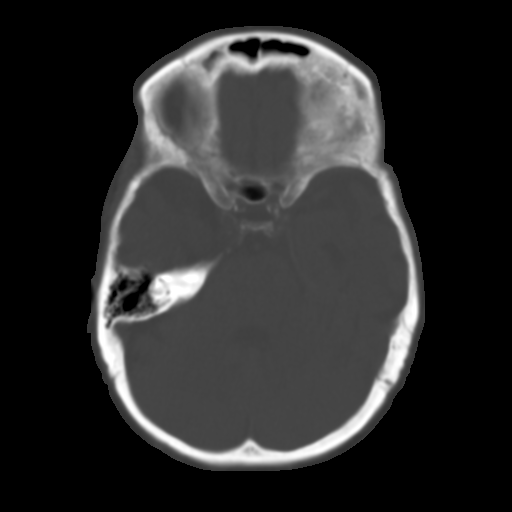
[im 11/32  brain]
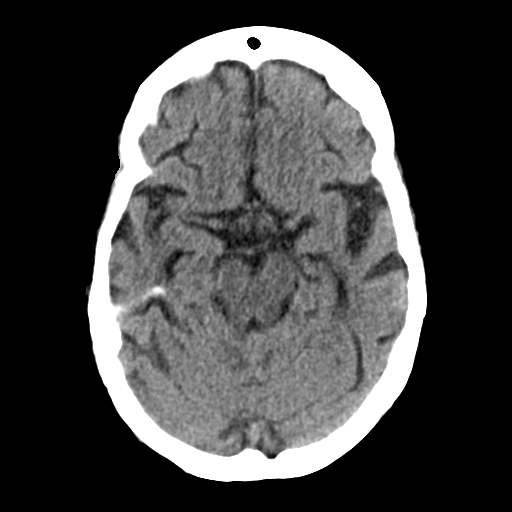
[im 13/32  brain]
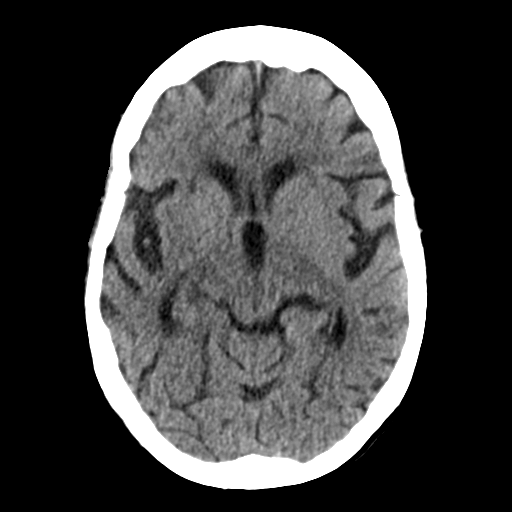
[im 15/32  brain]
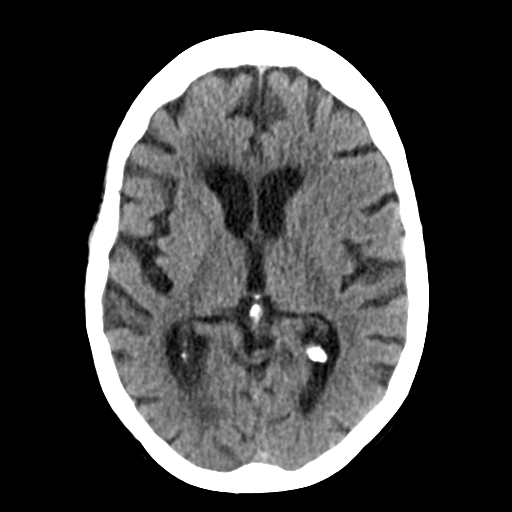
[im 17/32  brain]
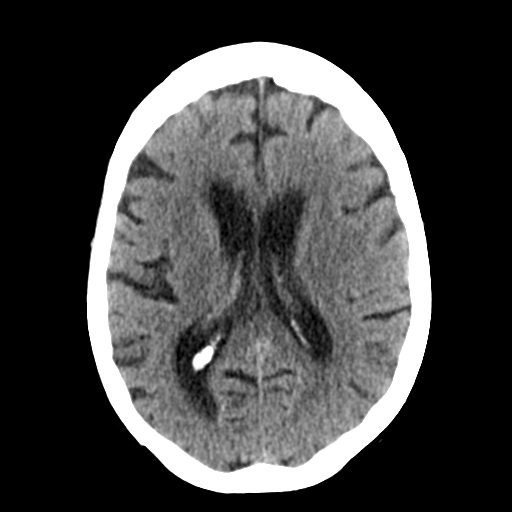
[im 17/32  bone]
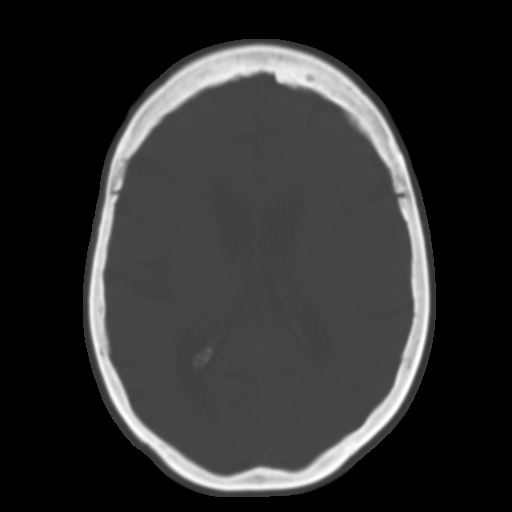
[im 19/32  brain]
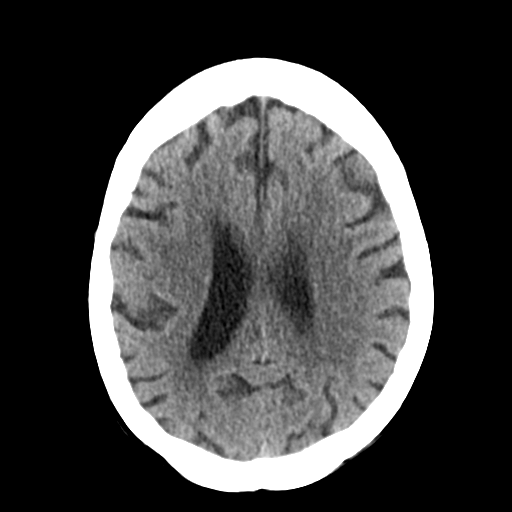
[im 21/32  brain]
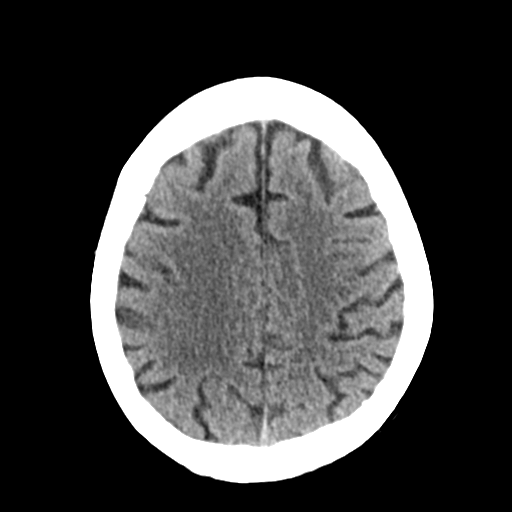
[im 23/32  brain]
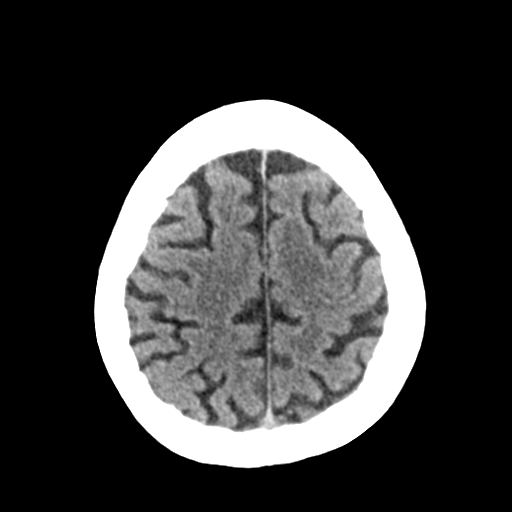
[im 24/32  brain]
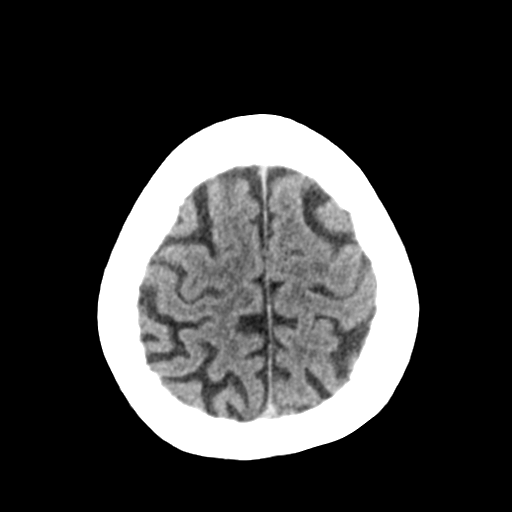
[im 24/32  bone]
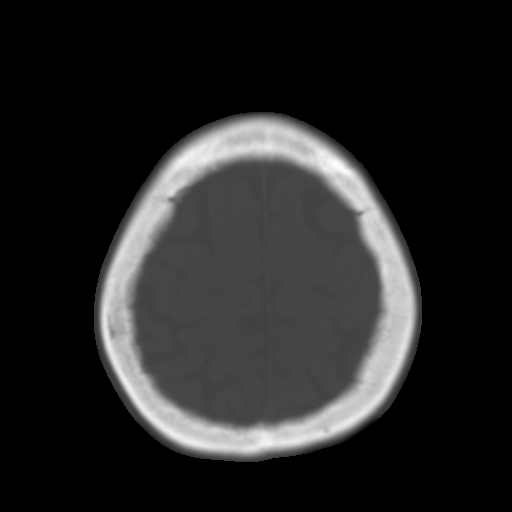
[im 26/32  brain]
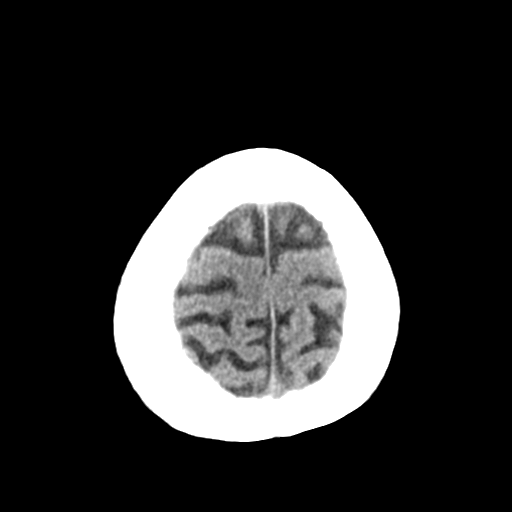
[im 28/32  brain]
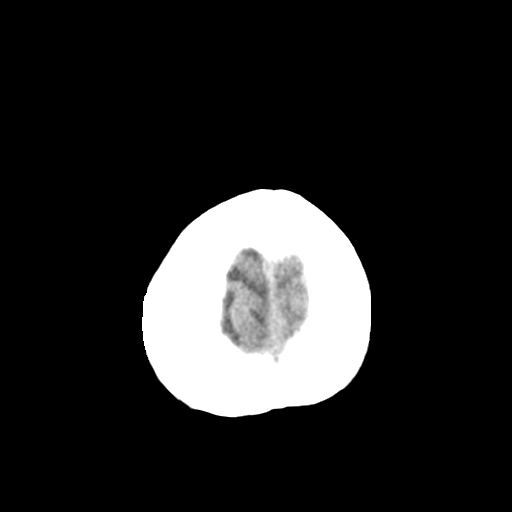
[im 30/32  brain]
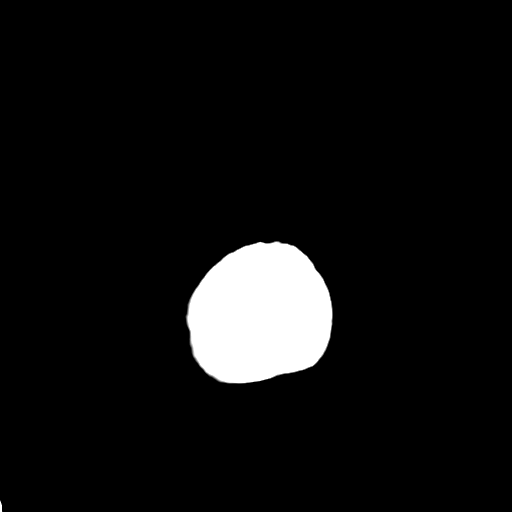

[16 of 30 positions shown; findings below may reference images not displayed]

FINDINGS: Atrophy and chronic microvascular ischemic change are again seen as
on the prior study. No evidence of acute abnormality including
hemorrhage, infarct, mass lesion, mass effect, midline shift or
abnormal extra-axial fluid collection is identified. No
hydrocephalus or pneumocephalus. The calvarium is intact. Imaged
paranasal sinuses and mastoid air cells are clear.
IMPRESSION: No acute abnormality.

Atrophy and chronic microvascular ischemic change.
